# Patient Record
Sex: Male | Born: 1980 | Race: Black or African American | Hispanic: No | Marital: Married | Smoking: Current some day smoker
Health system: Southern US, Community
[De-identification: ages and names within clinical notes are randomized; demographics above are authoritative.]

## PROBLEM LIST (undated history)

## (undated) DIAGNOSIS — I10 Essential (primary) hypertension: Secondary | ICD-10-CM

---

## 2001-10-31 ENCOUNTER — Encounter: Payer: Self-pay | Admitting: Emergency Medicine

## 2001-10-31 ENCOUNTER — Emergency Department (HOSPITAL_COMMUNITY): Admission: EM | Admit: 2001-10-31 | Discharge: 2001-10-31 | Payer: Self-pay | Admitting: Emergency Medicine

## 2005-10-18 ENCOUNTER — Emergency Department (HOSPITAL_COMMUNITY): Admission: EM | Admit: 2005-10-18 | Discharge: 2005-10-18 | Payer: Self-pay | Admitting: Emergency Medicine

## 2007-05-30 IMAGING — CR DG CERVICAL SPINE COMPLETE 4+V
5 series · 5 of 5 positions shown · non-contrast
Comparison: none

CLINICAL DATA: The patient hit his head with neck and back pain.
 THORACIC SPINE ? 2 VIEW ? 10/18/05:

[w c-spine lat]
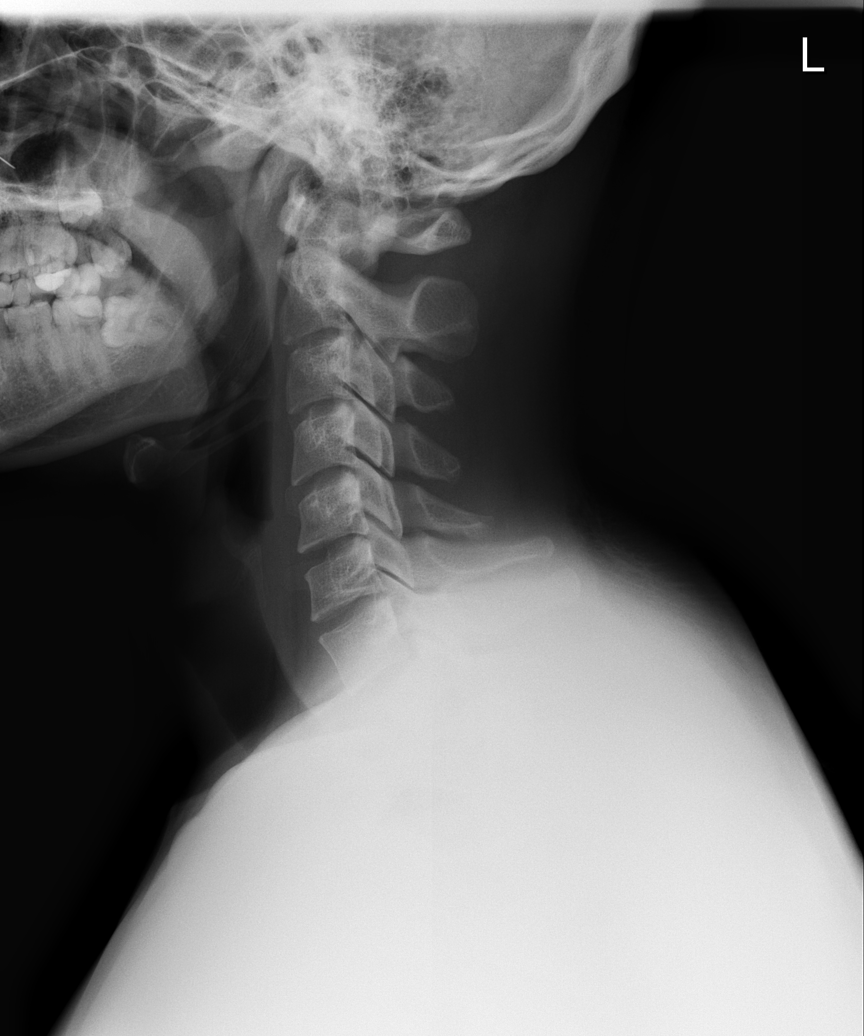

[w c-spine oblique (1 of 2)]
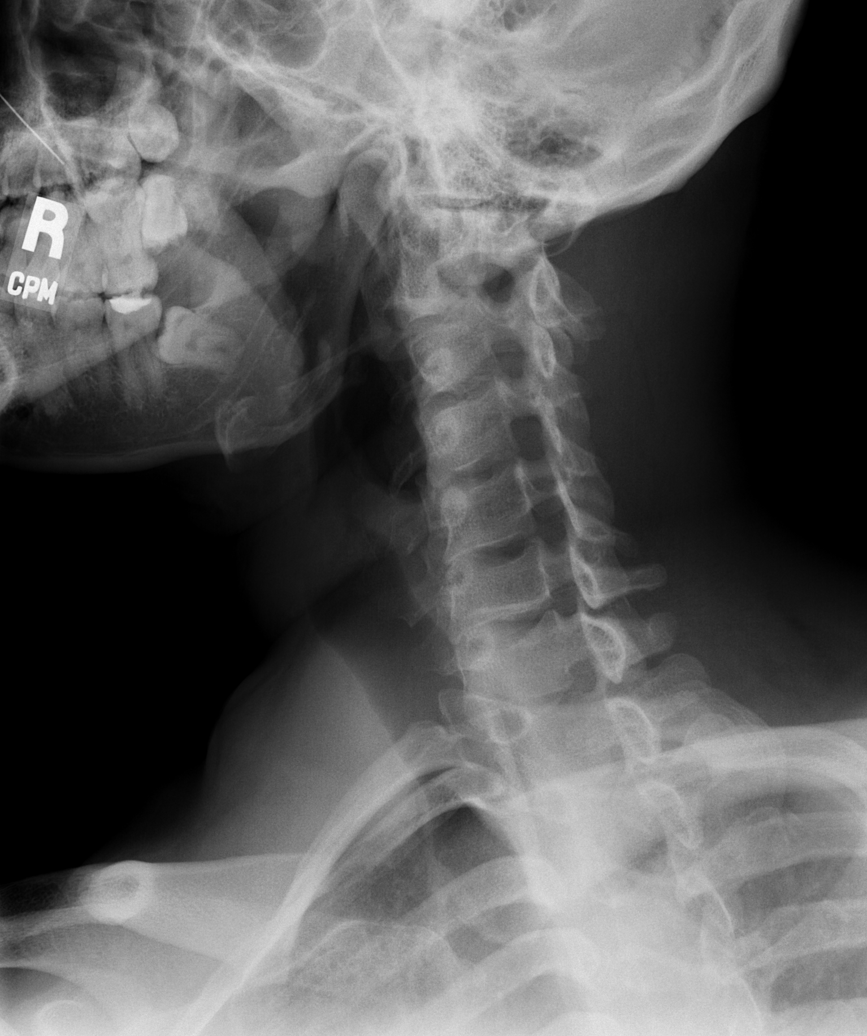

[w c-spine oblique (2 of 2)]
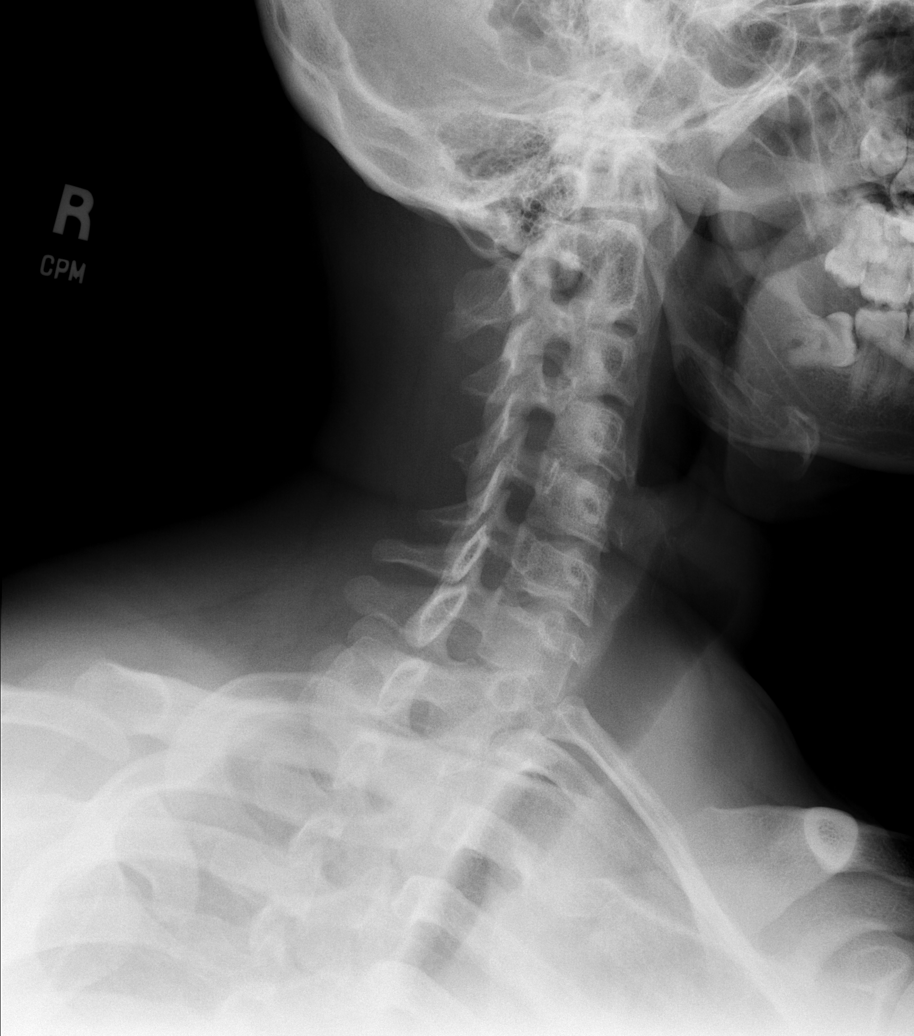

[w c-spine a.p. *]
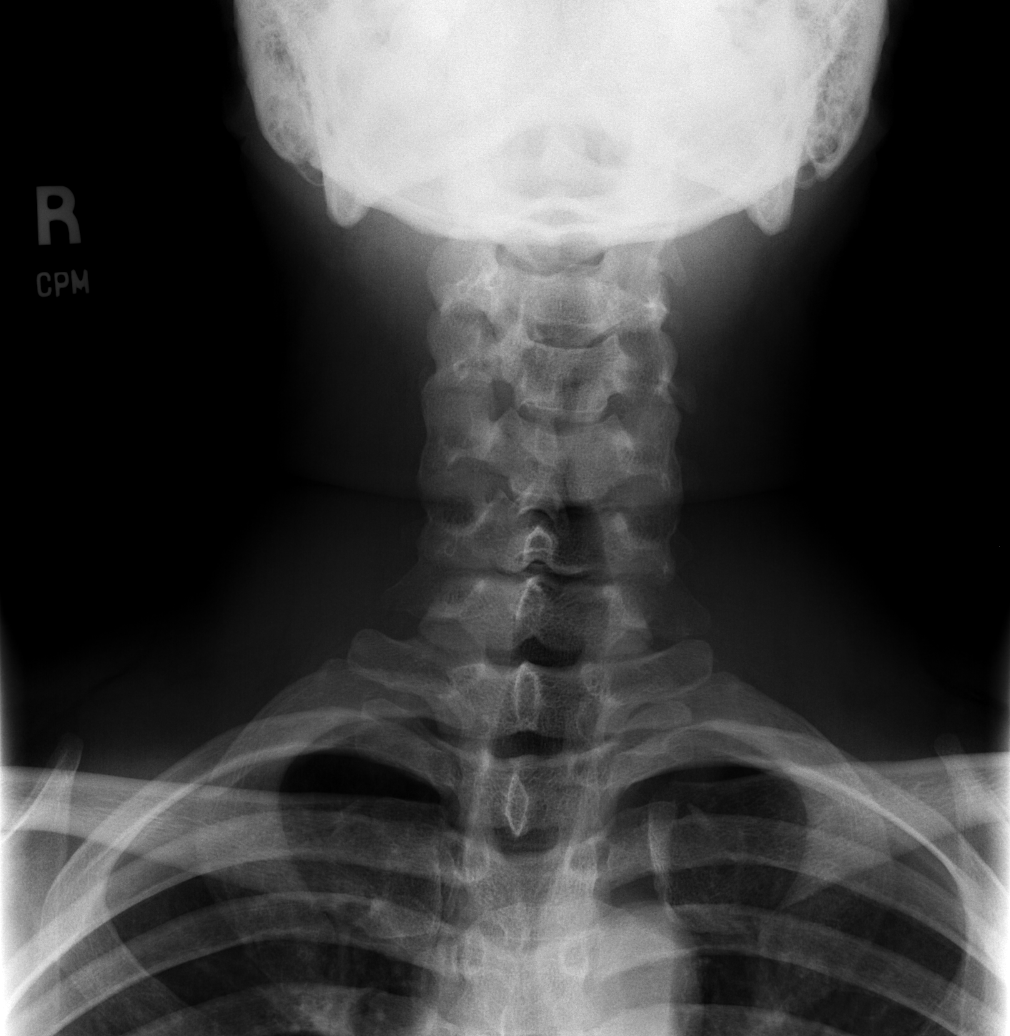

[w c-spine odontoid *]
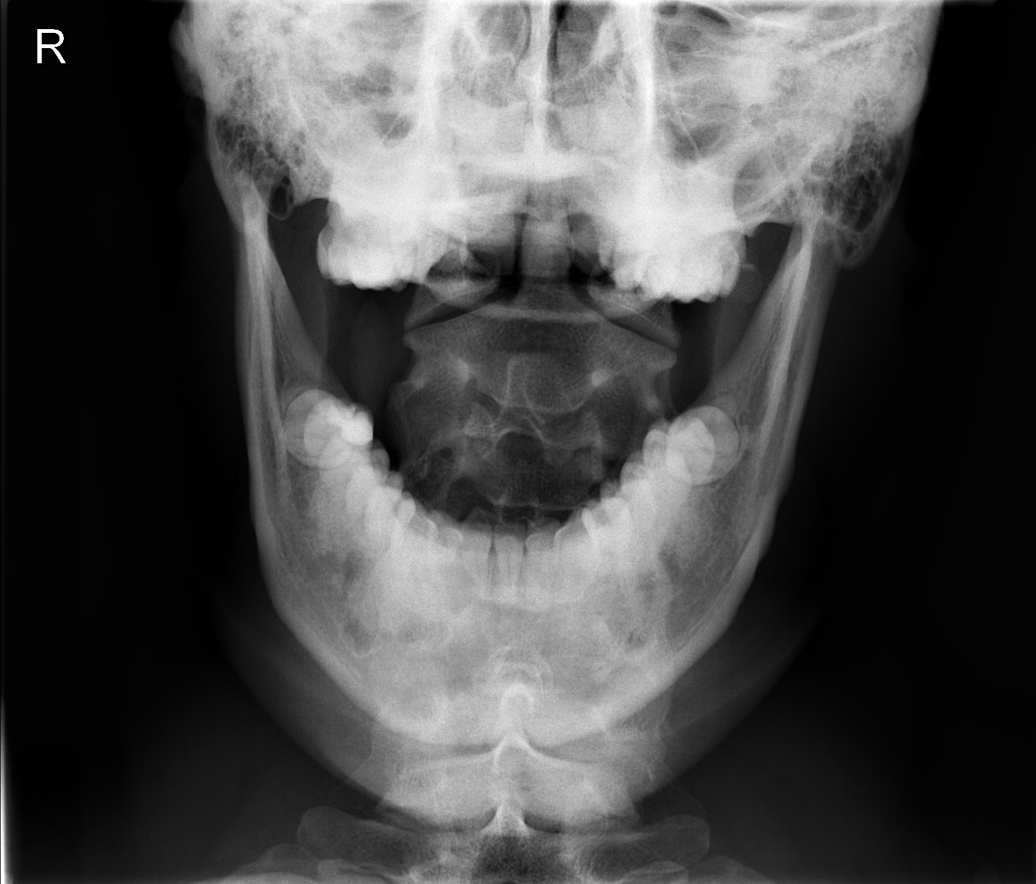

[5 of 5 positions shown; findings below may reference images not displayed]

FINDINGS: Alignment is anatomic.   Negative for fracture.   Visualized ribs are intact.
IMPRESSION: Negative for fracture.   
 CERVICAL SPINE ? 5 VIEW ? 10/18/05:
FINDINGS: Prevertebral soft tissues are normal.  Alignment of the cervical spine is anatomic.  The oblique projections show intact posterior elements and articulation.
IMPRESSION: Negative for fracture, subluxation, or static signs of instability.

## 2014-07-27 ENCOUNTER — Emergency Department (HOSPITAL_COMMUNITY)
Admission: EM | Admit: 2014-07-27 | Discharge: 2014-07-28 | Discharge status: Against Medical Advice | Payer: Self-pay | Attending: Emergency Medicine | Admitting: Emergency Medicine

## 2014-07-27 ENCOUNTER — Encounter (HOSPITAL_COMMUNITY): Payer: Self-pay

## 2014-07-27 DIAGNOSIS — R079 Chest pain, unspecified: Secondary | ICD-10-CM | POA: Insufficient documentation

## 2014-07-27 DIAGNOSIS — Z720 Tobacco use: Secondary | ICD-10-CM | POA: Insufficient documentation

## 2014-07-27 LAB — TROPONIN I: Troponin I: <0.3 ng/mL (ref <0.30)

## 2014-07-27 LAB — BASIC METABOLIC PANEL
ANION GAP: 14 (ref 5–15)
BUN: 9 mg/dL (ref 6–23)
CHLORIDE: 100 meq/L (ref 96–112)
CO2: 25 mEq/L (ref 19–32)
Calcium: 9.8 mg/dL (ref 8.4–10.5)
Creatinine, Ser: 0.92 mg/dL (ref 0.50–1.35)
GFR calc non Af Amer: >90 mL/min (ref >90)
Glucose, Bld: 90 mg/dL (ref 70–99)
POTASSIUM: 3.4 meq/L — AB (ref 3.7–5.3)
Sodium: 139 mEq/L (ref 137–147)

## 2014-07-27 LAB — CBC
HEMATOCRIT: 44 % (ref 39.0–52.0)
Hemoglobin: 15.1 g/dL (ref 13.0–17.0)
MCH: 28.6 pg (ref 26.0–34.0)
MCHC: 34.3 g/dL (ref 30.0–36.0)
MCV: 83.3 fL (ref 78.0–100.0)
PLATELETS: 160 10*3/uL (ref 150–400)
RBC: 5.28 MIL/uL (ref 4.22–5.81)
RDW: 14.4 % (ref 11.5–15.5)
WBC: 9 10*3/uL (ref 4.0–10.5)

## 2014-07-27 NOTE — ED Notes (Signed)
Pt presents with c/o mid center chest pain that started last night after he ate. Pt reports he does not know of a hx of acid reflux but he did eat his food very fast last night. Pt in NAD at this time.

## 2014-07-28 NOTE — ED Notes (Signed)
Pt called for treatment multiple times with no response.

## 2014-07-31 ENCOUNTER — Encounter (HOSPITAL_COMMUNITY): Payer: Self-pay | Admitting: Emergency Medicine

## 2014-07-31 ENCOUNTER — Emergency Department (HOSPITAL_COMMUNITY)
Admission: EM | Admit: 2014-07-31 | Discharge: 2014-07-31 | Disposition: A | Discharge status: Home / Self Care | Payer: BC Managed Care – PPO | Attending: Emergency Medicine | Admitting: Emergency Medicine

## 2014-07-31 DIAGNOSIS — R200 Anesthesia of skin: Secondary | ICD-10-CM | POA: Diagnosis not present

## 2014-07-31 DIAGNOSIS — Z720 Tobacco use: Secondary | ICD-10-CM | POA: Diagnosis not present

## 2014-07-31 DIAGNOSIS — R42 Dizziness and giddiness: Secondary | ICD-10-CM | POA: Diagnosis not present

## 2014-07-31 DIAGNOSIS — R531 Weakness: Secondary | ICD-10-CM | POA: Insufficient documentation

## 2014-07-31 LAB — COMPREHENSIVE METABOLIC PANEL
ALT: 76 U/L — ABNORMAL HIGH (ref 0–53)
AST: 53 U/L — ABNORMAL HIGH (ref 0–37)
Albumin: 4.5 g/dL (ref 3.5–5.2)
Alkaline Phosphatase: 70 U/L (ref 39–117)
Anion gap: 18 — ABNORMAL HIGH (ref 5–15)
BUN: 15 mg/dL (ref 6–23)
CO2: 20 mEq/L (ref 19–32)
Calcium: 10 mg/dL (ref 8.4–10.5)
Chloride: 104 mEq/L (ref 96–112)
Creatinine, Ser: 0.87 mg/dL (ref 0.50–1.35)
GFR calc Af Amer: >90 mL/min (ref >90)
GFR calc non Af Amer: >90 mL/min (ref >90)
Glucose, Bld: 88 mg/dL (ref 70–99)
Potassium: 4.1 mEq/L (ref 3.7–5.3)
Sodium: 142 mEq/L (ref 137–147)
Total Bilirubin: 0.8 mg/dL (ref 0.3–1.2)
Total Protein: 8.9 g/dL — ABNORMAL HIGH (ref 6.0–8.3)

## 2014-07-31 LAB — URINALYSIS, ROUTINE W REFLEX MICROSCOPIC
Bilirubin Urine: NEGATIVE
Glucose, UA: NEGATIVE mg/dL
Hgb urine dipstick: NEGATIVE
Ketones, ur: NEGATIVE mg/dL
Leukocytes, UA: NEGATIVE
Nitrite: NEGATIVE
Protein, ur: NEGATIVE mg/dL
Specific Gravity, Urine: 1.02 (ref 1.005–1.030)
Urobilinogen, UA: 1 mg/dL (ref 0.0–1.0)
pH: 7 (ref 5.0–8.0)

## 2014-07-31 LAB — CBC WITH DIFFERENTIAL/PLATELET
Basophils Absolute: 0 10*3/uL (ref 0.0–0.1)
Basophils Relative: 0 % (ref 0–1)
Eosinophils Absolute: 0.1 10*3/uL (ref 0.0–0.7)
Eosinophils Relative: 1 % (ref 0–5)
HCT: 47.4 % (ref 39.0–52.0)
Hemoglobin: 16.2 g/dL (ref 13.0–17.0)
Lymphocytes Relative: 35 % (ref 12–46)
Lymphs Abs: 3.6 10*3/uL (ref 0.7–4.0)
MCH: 28.9 pg (ref 26.0–34.0)
MCHC: 34.2 g/dL (ref 30.0–36.0)
MCV: 84.6 fL (ref 78.0–100.0)
Monocytes Absolute: 0.7 10*3/uL (ref 0.1–1.0)
Monocytes Relative: 7 % (ref 3–12)
Neutro Abs: 5.8 10*3/uL (ref 1.7–7.7)
Neutrophils Relative %: 57 % (ref 43–77)
Platelets: 148 10*3/uL — ABNORMAL LOW (ref 150–400)
RBC: 5.6 MIL/uL (ref 4.22–5.81)
RDW: 14.4 % (ref 11.5–15.5)
WBC: 10.2 10*3/uL (ref 4.0–10.5)

## 2014-07-31 LAB — RAPID URINE DRUG SCREEN, HOSP PERFORMED
Amphetamines: NOT DETECTED
Barbiturates: NOT DETECTED
Benzodiazepines: NOT DETECTED
Cocaine: NOT DETECTED
Opiates: NOT DETECTED
Tetrahydrocannabinol: NOT DETECTED

## 2014-07-31 MED ORDER — SODIUM CHLORIDE 0.9 % IV BOLUS (SEPSIS): 1000.0000 mL | Freq: Once | INTRAVENOUS | Status: DC

## 2014-07-31 NOTE — ED Notes (Signed)
Pt states he feels as if his arms are weak. Pt was seen on Sunday for chest pain but left AMA. Denies chest pain at the moment

## 2014-07-31 NOTE — Discharge Instructions (Signed)
Return here as needed. Follow up with the primary doctor provided. Increase your fluid intake

## 2014-07-31 NOTE — ED Notes (Signed)
Pt refused IV fluids, reports he is not dehydrated

## 2014-07-31 NOTE — ED Provider Notes (Signed)
CSN: [id number]     Arrival date & time 07/31/14  1610 History   First MD Initiated Contact with Patient 07/31/14 1652     Chief Complaint  Patient presents with  . Extremity Weakness    HPI: Timothy Vargas is a 33 year old male with no significant PMH who presents to the ED on 07/31/2014 for an episode of numbness and weakness along his upper and lower extremities that occurred around 3:00 PM this afternoon. Patient reports the episode lasted 10-15 minutes. Upper extremity involvement was mainly along the patient's forearms. He is unsure if it extended into his wrists and fingers. The numbness and weakness along his legs also occurred bilaterally and stretched from his knees to his ankles. He denies ever having an episode like this occur in the past. Patient was at work when he experienced these symptoms and had been working since 11:00 AM and did not have any symptoms until 3:00 PM. Patient says the type of work was not different from the work he has done over the past several months.  Patient reports he came to the ER on 07/27/2014 for chest pain, but waited 5+ hours in the waiting room and left the facility before being seen by a provider. He had consumed [place], yams, fried chicken, beans, and several sodas earlier that day and says his symptoms started after consuming the food. He took several TUMS while in the waiting room that day and says his symptoms resolved. He did not experience any shortness of breath, nausea, vomiting, or radiating pain at that time. He denies having any chest pain since then.  Patient does not currently have a PCP.  History reviewed. No pertinent past medical history. History reviewed. No pertinent past surgical history. No family history on file. History  Substance Use Topics  . Smoking status: Current Some Day Smoker  . Smokeless tobacco: Not on file  . Alcohol Use: Yes     Comment: occasionally     Review of Systems  Constitutional: Negative for fever,  chills, activity change, appetite change and fatigue.  HENT: Negative for congestion, rhinorrhea, sinus pressure, sore throat and trouble swallowing.   Eyes: Negative for photophobia and visual disturbance.  Respiratory: Negative for cough, choking, chest tightness, shortness of breath and wheezing.   Cardiovascular: Negative for chest pain, palpitations and leg swelling.  Gastrointestinal: Negative for nausea, vomiting, abdominal pain, diarrhea, constipation, blood in stool and abdominal distention.  Genitourinary: Negative for dysuria, urgency, frequency, flank pain, decreased urine volume and difficulty urinating.  Musculoskeletal: Negative for myalgias, back pain, arthralgias, neck pain and neck stiffness.  Skin: Negative for color change, pallor and rash.  Neurological: Positive for weakness, light-headedness and numbness. Negative for dizziness, tremors, seizures, syncope and headaches.  Hematological: Negative for adenopathy. Does not bruise/bleed easily.      Allergies  Review of patient's allergies indicates no known allergies.  Home Medications   Prior to Admission medications   Not on File   BP 169/103 mmHg  Pulse 61  Temp(Src) 98.9 F (37.2 C) (Oral)  Resp 20  SpO2 100% Physical Exam  Constitutional: He appears well-developed and well-nourished. No distress.  HENT:  Head: Normocephalic and atraumatic.  Right Ear: External ear normal.  Left Ear: External ear normal.  Mouth/Throat: Oropharynx is clear and moist. No oropharyngeal exudate.  Eyes: Conjunctivae are normal. Pupils are equal, round, and reactive to light.  Neck: Normal range of motion. Neck supple. No JVD present.  Cardiovascular: Normal rate, regular rhythm,  normal heart sounds and intact distal pulses.  Exam reveals no gallop and no friction rub.   No murmur heard. Pulmonary/Chest: Effort normal and breath sounds normal. No stridor. No respiratory distress. He has no wheezes. He has no rales. He exhibits  no tenderness.  Abdominal: Soft. Bowel sounds are normal. He exhibits no distension and no mass. There is no tenderness. There is no rebound and no guarding.  Musculoskeletal: Normal range of motion. He exhibits no edema or tenderness.  Negative Tinel's and Phalen's Test bilaterally.  Lymphadenopathy:    He has no cervical adenopathy.  Neurological: He has normal strength. He displays no atrophy and no tremor. No cranial nerve deficit or sensory deficit. He exhibits normal muscle tone. Coordination and gait normal.  Reflex Scores:      Tricep reflexes are 2+ on the right side and 2+ on the left side.      Bicep reflexes are 2+ on the right side and 2+ on the left side.      Brachioradialis reflexes are 2+ on the right side and 2+ on the left side.      Patellar reflexes are 2+ on the right side and 2+ on the left side.      Achilles reflexes are 2+ on the right side and 2+ on the left side. Strength 5/5 bilaterally at upper and lower extremities.  Skin: He is not diaphoretic.  Nursing note and vitals reviewed.   ED Course  Procedures (including critical care time) Labs Review Labs Reviewed  COMPREHENSIVE METABOLIC PANEL - Abnormal; Notable for the following:    Total Protein 8.9 (*)    AST 53 (*)    ALT 76 (*)    Anion gap 18 (*)    All other components within normal limits  CBC WITH DIFFERENTIAL - Abnormal; Notable for the following:    Platelets 148 (*)    All other components within normal limits  URINALYSIS, ROUTINE W REFLEX MICROSCOPIC - Abnormal; Notable for the following:    APPearance CLOUDY (*)    All other components within normal limits  URINE RAPID DRUG SCREEN (HOSP PERFORMED)    Imaging Review No results found.   EKG Interpretation None       Date: 08/01/2014  Rate: 59  Rhythm: normal sinus rhythm  QRS Axis: normal  Intervals: normal  ST/T Wave abnormalities: normal  Conduction Disutrbances:none  Narrative Interpretation:   Old EKG Reviewed: none  available  Patient is given primary care follow-up.  Told to return here as needed.  Patient's chest pain seems most likely related to reflux based on his history of present illness and physical exam.  He has not had any of this chest pain since Sunday.  Patient states he had a 3 minute episode of weakness in both arms.  This is pretty nonspecific and will be advised to follow up as an outpatient   Carlyle DollyChristopher W Yuriel Lopezmartinez, PA-C 08/01/14 [id number]  Purvis SheffieldForrest Harrison, MD 08/01/14 [phone]

## 2020-04-23 ENCOUNTER — Emergency Department (HOSPITAL_BASED_OUTPATIENT_CLINIC_OR_DEPARTMENT_OTHER)
Admission: EM | Admit: 2020-04-23 | Discharge: 2020-04-23 | Disposition: A | Discharge status: Home / Self Care | Payer: BC Managed Care – PPO | Attending: Emergency Medicine | Admitting: Emergency Medicine

## 2020-04-23 ENCOUNTER — Encounter (HOSPITAL_BASED_OUTPATIENT_CLINIC_OR_DEPARTMENT_OTHER): Payer: Self-pay | Admitting: *Deleted

## 2020-04-23 ENCOUNTER — Emergency Department (HOSPITAL_BASED_OUTPATIENT_CLINIC_OR_DEPARTMENT_OTHER): Payer: BC Managed Care – PPO

## 2020-04-23 ENCOUNTER — Other Ambulatory Visit: Payer: Self-pay

## 2020-04-23 DIAGNOSIS — F17290 Nicotine dependence, other tobacco product, uncomplicated: Secondary | ICD-10-CM | POA: Diagnosis not present

## 2020-04-23 DIAGNOSIS — U071 COVID-19: Secondary | ICD-10-CM | POA: Insufficient documentation

## 2020-04-23 DIAGNOSIS — I10 Essential (primary) hypertension: Secondary | ICD-10-CM | POA: Insufficient documentation

## 2020-04-23 DIAGNOSIS — J1282 Pneumonia due to coronavirus disease 2019: Secondary | ICD-10-CM | POA: Diagnosis not present

## 2020-04-23 DIAGNOSIS — R509 Fever, unspecified: Secondary | ICD-10-CM | POA: Diagnosis present

## 2020-04-23 HISTORY — DX: Essential (primary) hypertension: I10

## 2020-04-23 LAB — SARS CORONAVIRUS 2 BY RT PCR (HOSPITAL ORDER, PERFORMED IN ~~LOC~~ HOSPITAL LAB): SARS Coronavirus 2: POSITIVE — AB

## 2020-04-23 MED ORDER — ACETAMINOPHEN 325 MG PO TABS
ORAL_TABLET | ORAL | Status: AC
  Filled 2020-04-23: qty 2

## 2020-04-23 MED ORDER — NAPROXEN 500 MG PO TABS: ORAL_TABLET | ORAL | 0 refills | Status: AC

## 2020-04-23 MED ORDER — ACETAMINOPHEN 325 MG PO TABS
650.0000 mg | ORAL_TABLET | Freq: Once | ORAL | Status: AC
Start: 2020-04-23 — End: 2020-04-23
  Administered 2020-04-23: 650 mg via ORAL

## 2020-04-23 NOTE — ED Provider Notes (Signed)
[facility] Provider Note: Lowella Dell, MD, FACEP  CSN: [id number] MRN: [id number] ARRIVAL: 04/23/20 at 2058 ROOM: MH10/MH10   CHIEF COMPLAINT  Fever   HISTORY OF PRESENT ILLNESS  04/23/20 10:45 PM Timothy Vargas is a 39 y.o. male who began feeling bad 6 days ago.  His symptoms consisted primarily of generalized weakness and body aches.  He also had some diarrhea but no nausea or vomiting.  He has not had nasal congestion, sore throat, loss of taste or smell or shortness of breath.  Today he began coughing and developed a fever which is as high as 103 here, improved with acetaminophen.  He had been taking over-the-counter medication such as Mucinex, Tylenol and TheraFlu without significant improvement in his body aches and cough.  He has also had a headache on the top of his head.  He describes his body aches as moderate to severe and prevented him from working.   Past Medical History:  Diagnosis Date  . Hypertension     History reviewed. No pertinent surgical history.  No family history on file.  Social History   Tobacco Use  . Smoking status: Current Some Day Smoker    Types: Cigars  Substance Use Topics  . Alcohol use: Yes    Comment: occasionally (2-3 drinks per week)  . Drug use: No    Prior to Admission medications   Medication Sig Start Date End Date Taking? Authorizing Provider  naproxen (NAPROSYN) 500 MG tablet Take 1 tablet twice daily as needed for fever or body aches. 04/23/20   Makahla Kiser, Jonny Ruiz, MD    Allergies Patient has no known allergies.   REVIEW OF SYSTEMS  Negative except as noted here or in the History of Present Illness.   PHYSICAL EXAMINATION  Initial Vital Signs Blood pressure 121/74, pulse 76, temperature 98.6 F (37 C), temperature source Oral, resp. rate 18, height 5\' 7"  (1.702 m), weight 106.6 kg, SpO2 95 %.  Examination General: Well-developed, well-nourished male in no acute distress; appearance consistent with age of  record HENT: normocephalic; atraumatic Eyes: pupils equal, round and reactive to light; extraocular muscles intact Neck: supple Heart: regular rate and rhythm Lungs: clear to auscultation bilaterally Abdomen: soft; nondistended; nontender; bowel sounds present Extremities: No deformity; full range of motion; pulses normal Neurologic: Awake, alert and oriented; motor function intact in all extremities and symmetric; no facial droop Skin: Warm and dry Psychiatric: Normal mood and affect   RESULTS  Summary of this visit's results, reviewed and interpreted by myself:   EKG Interpretation  Date/Time:    Ventricular Rate:    PR Interval:    QRS Duration:   QT Interval:    QTC Calculation:   R Axis:     Text Interpretation:        Laboratory Studies: Results for orders placed or performed during the hospital encounter of 04/23/20 (from the past 24 hour(s))  SARS Coronavirus 2 by RT PCR (hospital order, performed in [facility] hospital lab) Nasopharyngeal Nasopharyngeal Swab     Status: Abnormal   Collection Time: 04/23/20  9:09 PM   Specimen: Nasopharyngeal Swab  Result Value Ref Range   SARS Coronavirus 2 POSITIVE (A) NEGATIVE   Imaging Studies: DG Chest Portable 1 View  Result Date: 04/23/2020 CLINICAL DATA:  Fever COVID symptoms EXAM: PORTABLE CHEST 1 VIEW COMPARISON:  None. FINDINGS: Patchy airspace opacity at the bases. No pleural effusion. Normal heart size. No pneumothorax IMPRESSION: Patchy airspace opacity at the bases, possible pneumonia.  Electronically Signed   By: Jasmine Pang M.D.   On: 04/23/2020 21:28    ED COURSE and MDM  Nursing notes, initial and subsequent vitals signs, including pulse oximetry, reviewed and interpreted by myself.  Vitals:   04/23/20 2104 04/23/20 2106 04/23/20 2156 04/23/20 2229  BP:  124/84  121/74  Pulse:  (!) 109  76  Resp:  18  18  Temp:  (!) 103 F (39.4 C) (!) 100.8 F (38.2 C) 98.6 F (37 C)  TempSrc:  Oral Oral Oral    SpO2:  97%  95%  Weight: 106.6 kg     Height: 5\' 7"  (1.702 m)      Medications  acetaminophen (TYLENOL) 325 MG tablet (has no administration in time range)  acetaminophen (TYLENOL) tablet 650 mg (650 mg Oral Given 04/23/20 2107)   The patient does not appear sick enough for hospital admission at this time and he is not short of breath or hypoxic.  We will contact the outpatient monoclonal antibody clinic to set him up for outpatient treatment.   PROCEDURES  Procedures   ED DIAGNOSES     ICD-10-CM   1. COVID-19 virus infection  U07.1   2. Pneumonia due to COVID-19 virus  U07.1    J12.82        Beonka Amesquita, 07-22-1988, MD 04/23/20 2300

## 2020-04-23 NOTE — ED Triage Notes (Signed)
C/o covid symptoms x 6 days

## 2020-04-23 NOTE — ED Notes (Signed)
States has had a fever x 1 weeks, having HA. Appetite is poor, has not rec the Covid Vaccines.

## 2020-04-24 ENCOUNTER — Telehealth: Payer: Self-pay | Admitting: Nurse Practitioner

## 2020-04-24 NOTE — Telephone Encounter (Signed)
Called to discuss with patient about Covid symptoms and the use of casirivimab/imdevimab, a monoclonal antibody infusion for those with mild to moderate Covid symptoms and at a high risk of hospitalization.  Pt is qualified for this infusion at the [facility] due to; Specific high risk criteria : BMI > 25   Message left to call back our hotline [phone].  Anysa Tacey, NP   

## 2020-04-25 ENCOUNTER — Other Ambulatory Visit: Payer: Self-pay | Admitting: Family

## 2020-04-25 ENCOUNTER — Telehealth: Payer: Self-pay | Admitting: Family

## 2020-04-25 DIAGNOSIS — U071 COVID-19: Secondary | ICD-10-CM

## 2020-04-25 DIAGNOSIS — I10 Essential (primary) hypertension: Secondary | ICD-10-CM

## 2020-04-25 DIAGNOSIS — Z6838 Body mass index (BMI) 38.0-38.9, adult: Secondary | ICD-10-CM

## 2020-04-25 NOTE — Progress Notes (Signed)
I connected by phone with Timothy Vargas on 04/25/2020 at 3:39 PM to discuss the potential use of a new treatment for mild to moderate COVID-19 viral infection in non-hospitalized patients.  This patient is a 39 y.o. male that meets the FDA criteria for Emergency Use Authorization of COVID monoclonal antibody casirivimab/imdevimab.  Has a (+) direct SARS-CoV-2 viral test result  Has mild or moderate COVID-19   Is NOT hospitalized due to COVID-19  Is within 10 days of symptom onset  Has at least one of the high risk factor(s) for progression to severe COVID-19 and/or hospitalization as defined in EUA.  Specific high risk criteria : BMI > 25 and Cardiovascular disease or hypertension   I have spoken and communicated the following to the patient or parent/caregiver regarding COVID monoclonal antibody treatment:  1. FDA has authorized the emergency use for the treatment of mild to moderate COVID-19 in adults and pediatric patients with positive results of direct SARS-CoV-2 viral testing who are 61 years of age and older weighing at least 40 kg, and who are at high risk for progressing to severe COVID-19 and/or hospitalization.  2. The significant known and potential risks and benefits of COVID monoclonal antibody, and the extent to which such potential risks and benefits are unknown.  3. Information on available alternative treatments and the risks and benefits of those alternatives, including clinical trials.  4. Patients treated with COVID monoclonal antibody should continue to self-isolate and use infection control measures (e.g., wear mask, isolate, social distance, avoid sharing personal items, clean and disinfect "high touch" surfaces, and frequent handwashing) according to CDC guidelines.   5. The patient or parent/caregiver has the option to accept or refuse COVID monoclonal antibody treatment.  After reviewing this information with the patient, The patient agreed to proceed with  receiving casirivimab\imdevimab infusion and will be provided a copy of the Fact sheet prior to receiving the infusion.    [name], NP 04/25/2020 3:39 PM

## 2020-04-25 NOTE — Telephone Encounter (Signed)
Called to Discuss with patient about Covid symptoms and the use of the monoclonal antibody infusion for those with mild to moderate Covid symptoms and at a high risk of hospitalization.     Pt appears to qualify for this infusion due to co-morbid conditions and/or a member of an at-risk group in accordance with the FDA Emergency Use Authorization.   Timothy Vargas has symptoms starting on 04/17/20 with positive COVID test on 04/23/20. Currently has fevers and body aches. Risk factors of BMI >25 and hypertension.   We discussed the following information:   If you have been tested outside of a [facility] Facility - you MUST bring a copy of your positive test with you the morning of your appointment. You may take a photo of this and upload to your MyChart portal or have the testing facility fax the result to [phone]    The address for the infusion clinic site is:  --GPS address is [address] - the parking is located near [employer] building where you will see  COVID19 Infusion feather banner marking the entrance to parking.   (see photos below)            --Enter into the 2nd entrance where the "wave, flag banner" is at the road. Turn into this 2nd entrance and immediately turn left to park in 1 of the 5 parking spots.   --Please stay in your car and call the desk for assistance inside [phone].   --Average time in department is roughly 2 hours for [facility] treatment - this includes preparation of the medication, IV start and the required 1 hour monitoring after the infusion.    Should you develop worsening shortness of breath, chest pain or severe breathing problems please do not wait for this appointment and go to the Emergency room for evaluation and treatment. You will undergo another oxygen screen before your infusion to ensure this is the best treatment option for you. There is a chance that the best decision may be to send you to the Emergency Room for evaluation at the  time of your appointment.   The day of your visit you should: Marland Kitchen Get plenty of rest the night before and drink plenty of water . Eat a light meal/snack before coming and take your medications as prescribed  . Wear warm, comfortable clothes with a shirt that can roll-up over the elbow (will need IV start).  . Wear a mask  . Consider bringing some activity to help pass the time  Many commercial insurers are waiving bills related to COVID treatment however some have ranged from $300-640. We are starting to see some insurers send bills to patients later for the administration of the medication - we are learning more information but you may receive a bill after your appointment.  Please contact your insurance agent to discuss prior to your appointment if you would like further details about billing specific to your policy.   Marcos Eke, NP

## 2020-04-26 ENCOUNTER — Ambulatory Visit (HOSPITAL_COMMUNITY)
Admission: RE | Admit: 2020-04-26 | Discharge: 2020-04-26 | Disposition: A | Discharge status: Home / Self Care | Payer: BC Managed Care – PPO | Source: Ambulatory Visit | Attending: Pulmonary Disease | Admitting: Pulmonary Disease

## 2020-04-26 DIAGNOSIS — Z6838 Body mass index (BMI) 38.0-38.9, adult: Secondary | ICD-10-CM | POA: Diagnosis present

## 2020-04-26 DIAGNOSIS — U071 COVID-19: Secondary | ICD-10-CM | POA: Insufficient documentation

## 2020-04-26 DIAGNOSIS — I10 Essential (primary) hypertension: Secondary | ICD-10-CM | POA: Diagnosis present

## 2020-04-26 MED ORDER — METHYLPREDNISOLONE SODIUM SUCC 125 MG IJ SOLR: 125.0000 mg | Freq: Once | INTRAMUSCULAR | Status: DC | PRN

## 2020-04-26 MED ORDER — SODIUM CHLORIDE 0.9 % IV SOLN
1200.0000 mg | Freq: Once | INTRAVENOUS | Status: AC
  Administered 2020-04-26: 1200 mg via INTRAVENOUS
  Filled 2020-04-26: qty 10

## 2020-04-26 MED ORDER — ALBUTEROL SULFATE HFA 108 (90 BASE) MCG/ACT IN AERS: 2.0000 | INHALATION_SPRAY | Freq: Once | RESPIRATORY_TRACT | Status: DC | PRN

## 2020-04-26 MED ORDER — EPINEPHRINE 0.3 MG/0.3ML IJ SOAJ: 0.3000 mg | Freq: Once | INTRAMUSCULAR | Status: DC | PRN

## 2020-04-26 MED ORDER — FAMOTIDINE IN NACL 20-0.9 MG/50ML-% IV SOLN: 20.0000 mg | Freq: Once | INTRAVENOUS | Status: DC | PRN

## 2020-04-26 MED ORDER — SODIUM CHLORIDE 0.9 % IV SOLN: INTRAVENOUS | Status: DC | PRN

## 2020-04-26 MED ORDER — DIPHENHYDRAMINE HCL 50 MG/ML IJ SOLN: 50.0000 mg | Freq: Once | INTRAMUSCULAR | Status: DC | PRN

## 2020-04-26 NOTE — Discharge Instructions (Signed)

## 2020-04-26 NOTE — Progress Notes (Signed)
  Diagnosis: COVID-19  Physician: Wright, MD  Procedure: Covid Infusion Clinic Med: casirivimab\imdevimab infusion - Provided patient with casirivimab\imdevimab fact sheet for patients, parents and caregivers prior to infusion.  Complications: No immediate complications noted.  Discharge: Discharged home   Sakai Wolford R Jalayia Bagheri 04/26/2020   

## 2020-04-26 NOTE — Progress Notes (Signed)
  Diagnosis: COVID-19  Physician:Dr Wright   Procedure: Covid Infusion Clinic Med: casirivimab\imdevimab infusion - Provided patient with casirivimab\imdevimab fact sheet for patients, parents and caregivers prior to infusion.  Complications: No immediate complications noted.  Discharge: Discharged home   Miguelangel Korn W 04/26/2020  

## 2022-02-12 ENCOUNTER — Encounter (HOSPITAL_BASED_OUTPATIENT_CLINIC_OR_DEPARTMENT_OTHER): Payer: Self-pay | Admitting: Emergency Medicine

## 2022-02-12 ENCOUNTER — Other Ambulatory Visit: Payer: Self-pay

## 2022-02-12 ENCOUNTER — Emergency Department (HOSPITAL_BASED_OUTPATIENT_CLINIC_OR_DEPARTMENT_OTHER)
Admission: EM | Admit: 2022-02-12 | Discharge: 2022-02-12 | Disposition: A | Discharge status: Home / Self Care | Payer: BC Managed Care – PPO | Attending: Emergency Medicine | Admitting: Emergency Medicine

## 2022-02-12 DIAGNOSIS — R197 Diarrhea, unspecified: Secondary | ICD-10-CM | POA: Insufficient documentation

## 2022-02-12 DIAGNOSIS — M791 Myalgia, unspecified site: Secondary | ICD-10-CM | POA: Diagnosis not present

## 2022-02-12 DIAGNOSIS — R531 Weakness: Secondary | ICD-10-CM | POA: Insufficient documentation

## 2022-02-12 DIAGNOSIS — Z20822 Contact with and (suspected) exposure to covid-19: Secondary | ICD-10-CM | POA: Diagnosis not present

## 2022-02-12 LAB — URINALYSIS, ROUTINE W REFLEX MICROSCOPIC
Bilirubin Urine: NEGATIVE
Glucose, UA: NEGATIVE mg/dL
Hgb urine dipstick: NEGATIVE
Ketones, ur: NEGATIVE mg/dL
Leukocytes,Ua: NEGATIVE
Nitrite: NEGATIVE
Protein, ur: NEGATIVE mg/dL
Specific Gravity, Urine: 1.02 (ref 1.005–1.030)
pH: 8.5 — ABNORMAL HIGH (ref 5.0–8.0)

## 2022-02-12 LAB — CBG MONITORING, ED: Glucose-Capillary: 108 mg/dL — ABNORMAL HIGH (ref 70–99)

## 2022-02-12 LAB — SARS CORONAVIRUS 2 BY RT PCR: SARS Coronavirus 2 by RT PCR: NEGATIVE

## 2022-02-12 NOTE — Discharge Instructions (Signed)
No signs of urine infection.  Blood sugar is normal.  COVID test is negative.  Overall suspect you have a resolving viral process.

## 2022-02-12 NOTE — ED Provider Notes (Signed)
[facility] EMERGENCY DEPARTMENT Provider Note   CSN: [id number] Arrival date & time: 02/12/22  2033     History  Chief Complaint  Patient presents with   Diarrhea    Timothy Vargas is a 41 y.o. male.  Patient here with some diarrhea, generalized body aches.  He has been able to eat and drink well today.  He denies any abdominal pain, nausea, vomiting.  He is having frequent urination.  He is concerned for may be urine infection or STD.  But he denies any discharge, lesions, pain with urination.  Denies history of diabetes.  Nothing makes it worse or better.  No suspicious food intake.  Possibly sick contact.  The history is provided by the patient.       Home Medications Prior to Admission medications   Medication Sig Start Date End Date Taking? Authorizing Provider  naproxen (NAPROSYN) 500 MG tablet Take 1 tablet twice daily as needed for fever or body aches. 04/23/20   Molpus, Jonny Ruiz, MD      Allergies    Patient has no known allergies.    Review of Systems   Review of Systems  Physical Exam Updated Vital Signs BP (!) 149/90 (BP Location: Left Arm)   Pulse 69   Temp 98.6 F (37 C) (Oral)   Resp 19   SpO2 97%  Physical Exam Vitals and nursing note reviewed.  Constitutional:      General: He is not in acute distress.    Appearance: He is well-developed. He is not ill-appearing.  HENT:     Head: Normocephalic and atraumatic.     Nose: Nose normal.     Mouth/Throat:     Mouth: Mucous membranes are moist.  Eyes:     Extraocular Movements: Extraocular movements intact.     Conjunctiva/sclera: Conjunctivae normal.     Pupils: Pupils are equal, round, and reactive to light.  Cardiovascular:     Rate and Rhythm: Normal rate and regular rhythm.     Pulses: Normal pulses.     Heart sounds: Normal heart sounds. No murmur heard. Pulmonary:     Effort: Pulmonary effort is normal. No respiratory distress.     Breath sounds: Normal breath sounds.  Abdominal:      Palpations: Abdomen is soft.     Tenderness: There is no abdominal tenderness.  Musculoskeletal:        General: No swelling.     Cervical back: Normal range of motion and neck supple.  Skin:    General: Skin is warm and dry.     Capillary Refill: Capillary refill takes less than 2 seconds.  Neurological:     General: No focal deficit present.     Mental Status: He is alert.  Psychiatric:        Mood and Affect: Mood normal.     ED Results / Procedures / Treatments   Labs (all labs ordered are listed, but only abnormal results are displayed) Labs Reviewed  URINALYSIS, ROUTINE W REFLEX MICROSCOPIC - Abnormal; Notable for the following components:      Result Value   pH 8.5 (*)    All other components within normal limits  CBG MONITORING, ED - Abnormal; Notable for the following components:   Glucose-Capillary 108 (*)    All other components within normal limits  SARS CORONAVIRUS 2 BY RT PCR  GC/CHLAMYDIA PROBE AMP ([place]) NOT AT [facility]    EKG None  Radiology No results found.  Procedures Procedures  Medications Ordered in ED Medications - No data to display  ED Course/ Medical Decision Making/ A&P                           Medical Decision Making Amount and/or Complexity of Data Reviewed Labs: ordered.   Timothy Vargas is here with diarrhea.  Normal vitals.  No fever.  Overcoming a diarrheal illness.  He has had some generalized weakness.  Has had increased urination and is concerned may be for an infection or STD.  He has no pain with urination.  No lesions.  GU exam was deferred per patient preference.  Overall very well-appearing.  Normal vitals.  No signs of dehydration on exam.  Urinalysis is negative for infection.  Blood sugar is normal.  No concern for diabetes process.  COVID test is negative.  Overall suspect resolving viral process.  We will have him follow-up as a CD test.  Discharged in good condition.  Understands return precautions.  This  chart was dictated using voice recognition software.  Despite best efforts to proofread,  errors can occur which can change the documentation meaning.         Final Clinical Impression(s) / ED Diagnoses Final diagnoses:  Diarrhea, unspecified type    Rx / DC Orders ED Discharge Orders     None         Virgina Norfolk, DO 02/12/22 2217

## 2022-02-12 NOTE — ED Triage Notes (Signed)
Diarrhea, intermittent diaphoresis, generalized malaise x 3 days.

## 2022-02-14 LAB — GC/CHLAMYDIA PROBE AMP (~~LOC~~) NOT AT ARMC
Chlamydia: NEGATIVE
Comment: NEGATIVE
Comment: NORMAL
Neisseria Gonorrhea: NEGATIVE

## 2022-03-30 ENCOUNTER — Other Ambulatory Visit: Payer: Self-pay

## 2022-03-30 ENCOUNTER — Emergency Department (HOSPITAL_BASED_OUTPATIENT_CLINIC_OR_DEPARTMENT_OTHER)
Admission: EM | Admit: 2022-03-30 | Discharge: 2022-03-30 | Disposition: A | Discharge status: Home / Self Care | Payer: BC Managed Care – PPO | Attending: Emergency Medicine | Admitting: Emergency Medicine

## 2022-03-30 ENCOUNTER — Encounter (HOSPITAL_BASED_OUTPATIENT_CLINIC_OR_DEPARTMENT_OTHER): Payer: Self-pay | Admitting: Urology

## 2022-03-30 DIAGNOSIS — R5383 Other fatigue: Secondary | ICD-10-CM

## 2022-03-30 DIAGNOSIS — Z20822 Contact with and (suspected) exposure to covid-19: Secondary | ICD-10-CM | POA: Insufficient documentation

## 2022-03-30 DIAGNOSIS — E876 Hypokalemia: Secondary | ICD-10-CM | POA: Diagnosis not present

## 2022-03-30 DIAGNOSIS — I10 Essential (primary) hypertension: Secondary | ICD-10-CM | POA: Insufficient documentation

## 2022-03-30 DIAGNOSIS — Z79899 Other long term (current) drug therapy: Secondary | ICD-10-CM | POA: Insufficient documentation

## 2022-03-30 DIAGNOSIS — B349 Viral infection, unspecified: Secondary | ICD-10-CM | POA: Diagnosis not present

## 2022-03-30 LAB — COMPREHENSIVE METABOLIC PANEL
ALT: 94 U/L — ABNORMAL HIGH (ref 0–44)
AST: 83 U/L — ABNORMAL HIGH (ref 15–41)
Albumin: 3.8 g/dL (ref 3.5–5.0)
Alkaline Phosphatase: 57 U/L (ref 38–126)
Anion gap: 8 (ref 5–15)
BUN: 8 mg/dL (ref 6–20)
CO2: 23 mmol/L (ref 22–32)
Calcium: 8.2 mg/dL — ABNORMAL LOW (ref 8.9–10.3)
Chloride: 107 mmol/L (ref 98–111)
Creatinine, Ser: 1.21 mg/dL (ref 0.61–1.24)
GFR, Estimated: >60 mL/min (ref >60)
Glucose, Bld: 109 mg/dL — ABNORMAL HIGH (ref 70–99)
Potassium: 3.1 mmol/L — ABNORMAL LOW (ref 3.5–5.1)
Sodium: 138 mmol/L (ref 135–145)
Total Bilirubin: 1 mg/dL (ref 0.3–1.2)
Total Protein: 8 g/dL (ref 6.5–8.1)

## 2022-03-30 LAB — SARS CORONAVIRUS 2 BY RT PCR: SARS Coronavirus 2 by RT PCR: NEGATIVE

## 2022-03-30 LAB — CBC WITH DIFFERENTIAL/PLATELET
Abs Immature Granulocytes: 0.04 10*3/uL (ref 0.00–0.07)
Basophils Absolute: 0.1 10*3/uL (ref 0.0–0.1)
Basophils Relative: 1 %
Eosinophils Absolute: 0 10*3/uL (ref 0.0–0.5)
Eosinophils Relative: 0 %
HCT: 37 % — ABNORMAL LOW (ref 39.0–52.0)
Hemoglobin: 11.5 g/dL — ABNORMAL LOW (ref 13.0–17.0)
Immature Granulocytes: 1 %
Lymphocytes Relative: 25 %
Lymphs Abs: 1.7 10*3/uL (ref 0.7–4.0)
MCH: 23.4 pg — ABNORMAL LOW (ref 26.0–34.0)
MCHC: 31.1 g/dL (ref 30.0–36.0)
MCV: 75.4 fL — ABNORMAL LOW (ref 80.0–100.0)
Monocytes Absolute: 1 10*3/uL (ref 0.1–1.0)
Monocytes Relative: 16 %
Neutro Abs: 3.7 10*3/uL (ref 1.7–7.7)
Neutrophils Relative %: 57 %
Platelets: 187 10*3/uL (ref 150–400)
RBC: 4.91 MIL/uL (ref 4.22–5.81)
RDW: 15.1 % (ref 11.5–15.5)
WBC: 6.5 10*3/uL (ref 4.0–10.5)
nRBC: 0 % (ref 0.0–0.2)

## 2022-03-30 MED ORDER — POTASSIUM CHLORIDE CRYS ER 20 MEQ PO TBCR
40.0000 meq | EXTENDED_RELEASE_TABLET | Freq: Once | ORAL | Status: AC
  Administered 2022-03-30: 40 meq via ORAL
  Filled 2022-03-30: qty 2

## 2022-03-30 MED ORDER — ACETAMINOPHEN 500 MG PO TABS
1000.0000 mg | ORAL_TABLET | Freq: Four times a day (QID) | ORAL | Status: DC | PRN
  Administered 2022-03-30: 1000 mg via ORAL
  Filled 2022-03-30: qty 2

## 2022-03-30 NOTE — ED Triage Notes (Signed)
Pt states "I just feel down", loss of appetite, chills, and fatigue  Denies fever, Denies N/V/D No exposure to anyone sick

## 2022-03-30 NOTE — Discharge Instructions (Signed)
Your COVID test today was negative.  Your labs were also reassuring although your potassium was a little low so we gave you a tablet here in the emergency department.  You likely are suffering from another viral infection.  Continue to treat your symptoms with Motrin or Tylenol.  Drink plenty of fluids and get some rest.  Please refrain from going to work until you are fever free for at least 24 hours.

## 2022-03-30 NOTE — ED Provider Notes (Signed)
[facility] EMERGENCY DEPARTMENT Provider Note   CSN: [id number] Arrival date & time: 03/30/22  1627     History  Chief Complaint  Patient presents with   Fatigue    Timothy Vargas is a 41 y.o. male with history of hypertension who presents to the emergency department for evaluation of "just feeling down".  Patient states that yesterday afternoon, he began to feel very fatigued and his body felt heavy.  He also endorses loss of appetite, chills and a very mild dry cough.  He denies fever, abdominal pain, nausea, vomiting, diarrhea, numbness and tingling.  He has no known sick exposures no recent travel.  No treatment prior to arrival.  HPI     Home Medications Prior to Admission medications   Medication Sig Start Date End Date Taking? Authorizing Provider  amLODipine (NORVASC) 10 MG tablet Take 10 mg by mouth daily. 01/21/22   [provider]  losartan (COZAAR) 50 MG tablet Take 50 mg by mouth daily. 01/27/22   [provider]  naproxen (NAPROSYN) 500 MG tablet Take 1 tablet twice daily as needed for fever or body aches. 04/23/20   Molpus, Jonny Ruiz, MD      Allergies    Patient has no known allergies.    Review of Systems   Review of Systems  Constitutional:  Positive for fatigue. Negative for fever.  Respiratory:  Positive for cough. Negative for shortness of breath.   Cardiovascular:  Negative for chest pain.  Gastrointestinal:  Negative for abdominal pain and vomiting.    Physical Exam Updated Vital Signs BP (!) 147/89   Pulse 82   Temp 100 F (37.8 C) (Oral)   Resp 19   Ht 5\' 7"  (1.702 m)   Wt 117.9 kg   SpO2 99%   BMI 40.72 kg/m  Physical Exam Vitals and nursing note reviewed.  Constitutional:      General: He is not in acute distress.    Appearance: He is not ill-appearing.  HENT:     Head: Atraumatic.     Right Ear: Tympanic membrane normal.     Left Ear: Tympanic membrane normal.     Nose: Nose normal.     Mouth/Throat:      Mouth: Mucous membranes are moist.     Pharynx: Oropharynx is clear.  Eyes:     Conjunctiva/sclera: Conjunctivae normal.  Cardiovascular:     Rate and Rhythm: Normal rate and regular rhythm.     Pulses: Normal pulses.     Heart sounds: No murmur heard. Pulmonary:     Effort: Pulmonary effort is normal. No respiratory distress.     Breath sounds: Normal breath sounds.  Abdominal:     General: Abdomen is flat. There is no distension.     Palpations: Abdomen is soft.     Tenderness: There is no abdominal tenderness.  Musculoskeletal:        General: Normal range of motion.     Cervical back: Normal range of motion.  Skin:    General: Skin is warm and dry.     Capillary Refill: Capillary refill takes less than 2 seconds.  Neurological:     General: No focal deficit present.     Mental Status: He is alert.  Psychiatric:        Mood and Affect: Mood normal.     ED Results / Procedures / Treatments   Labs (all labs ordered are listed, but only abnormal results are displayed) Labs Reviewed  CBC  WITH DIFFERENTIAL/PLATELET - Abnormal; Notable for the following components:      Result Value   Hemoglobin 11.5 (*)    HCT 37.0 (*)    MCV 75.4 (*)    MCH 23.4 (*)    All other components within normal limits  COMPREHENSIVE METABOLIC PANEL - Abnormal; Notable for the following components:   Potassium 3.1 (*)    Glucose, Bld 109 (*)    Calcium 8.2 (*)    AST 83 (*)    ALT 94 (*)    All other components within normal limits  SARS CORONAVIRUS 2 BY RT PCR    EKG None  Radiology No results found.  Procedures Procedures    Medications Ordered in ED Medications  acetaminophen (TYLENOL) tablet 1,000 mg (1,000 mg Oral Given 03/30/22 1714)  potassium chloride SA (KLOR-CON M) CR tablet 40 mEq (40 mEq Oral Given 03/30/22 1735)    ED Course/ Medical Decision Making/ A&P                           Medical Decision Making Amount and/or Complexity of Data Reviewed Labs:  ordered.  Risk OTC drugs. Prescription drug management.   41 year old male presents emergency department for evaluation of vague complaints such as fatigue, "feeling down" and mild cough.  Patient was febrile at triage to 100.7 F.  He is overall well-appearing and in no acute distress.  He was satting well on room air.  Physical exam was overall benign without any acute findings.  Lung sounds normal.  Given his vague complaints, I did order CMP and CBC along with COVID test.  His potassium was mildly decreased at 3.1 so I ordered 40 mEq p.o. of potassium to replete this.  CBC unremarkable and COVID was negative.  His LFTs were mildly elevated although upon chart review this has been documented in the past.  Patient is aware of this and is followed by his PCP.  Advised to continue Motrin and Tylenol for fevers and body aches.  Symptomatic management recommended.  Patient expresses understanding of the plan.  Discharged home in stable condition.  Final Clinical Impression(s) / ED Diagnoses Final diagnoses:  Viral infection  Other fatigue  Hypokalemia    Rx / DC Orders ED Discharge Orders     None         Janell Quiet, PA-C 03/30/22 1804    Virgina Norfolk, DO 03/30/22 1843
